# Patient Record
Sex: Female | Born: 1950 | Race: White | Hispanic: No | Marital: Married | Smoking: Never smoker
Health system: Southern US, Community
[De-identification: ages and names within clinical notes are randomized; demographics above are authoritative.]

## PROBLEM LIST (undated history)

## (undated) DIAGNOSIS — E559 Vitamin D deficiency, unspecified: Secondary | ICD-10-CM

## (undated) HISTORY — DX: Vitamin D deficiency, unspecified: E55.9

## (undated) HISTORY — PX: VEIN LIGATION AND STRIPPING: SHX2653

---

## 1997-12-03 ENCOUNTER — Other Ambulatory Visit: Admission: RE | Admit: 1997-12-03 | Discharge: 1997-12-03 | Payer: Self-pay | Admitting: Obstetrics and Gynecology

## 1998-07-21 ENCOUNTER — Ambulatory Visit (HOSPITAL_BASED_OUTPATIENT_CLINIC_OR_DEPARTMENT_OTHER): Admission: RE | Admit: 1998-07-21 | Discharge: 1998-07-21 | Payer: Self-pay | Admitting: General Surgery

## 2000-12-15 ENCOUNTER — Encounter: Payer: Self-pay | Admitting: Vascular Surgery

## 2000-12-20 ENCOUNTER — Ambulatory Visit (HOSPITAL_COMMUNITY): Admission: RE | Admit: 2000-12-20 | Discharge: 2000-12-20 | Payer: Self-pay | Admitting: Vascular Surgery

## 2000-12-20 ENCOUNTER — Encounter (INDEPENDENT_AMBULATORY_CARE_PROVIDER_SITE_OTHER): Payer: Self-pay | Admitting: *Deleted

## 2001-01-17 ENCOUNTER — Other Ambulatory Visit: Admission: RE | Admit: 2001-01-17 | Discharge: 2001-01-17 | Payer: Self-pay | Admitting: Obstetrics and Gynecology

## 2002-01-03 ENCOUNTER — Encounter (HOSPITAL_COMMUNITY): Admission: RE | Admit: 2002-01-03 | Discharge: 2002-01-03 | Payer: Self-pay | Admitting: Internal Medicine

## 2002-02-28 ENCOUNTER — Other Ambulatory Visit: Admission: RE | Admit: 2002-02-28 | Discharge: 2002-02-28 | Payer: Self-pay | Admitting: Obstetrics and Gynecology

## 2003-07-22 ENCOUNTER — Other Ambulatory Visit: Admission: RE | Admit: 2003-07-22 | Discharge: 2003-07-22 | Payer: Self-pay | Admitting: Obstetrics and Gynecology

## 2004-07-19 ENCOUNTER — Other Ambulatory Visit: Admission: RE | Admit: 2004-07-19 | Discharge: 2004-07-19 | Payer: Self-pay | Admitting: Obstetrics and Gynecology

## 2005-07-25 ENCOUNTER — Other Ambulatory Visit: Admission: RE | Admit: 2005-07-25 | Discharge: 2005-07-25 | Payer: Self-pay | Admitting: Obstetrics and Gynecology

## 2006-03-20 ENCOUNTER — Ambulatory Visit: Payer: Self-pay | Admitting: Internal Medicine

## 2006-03-21 ENCOUNTER — Ambulatory Visit: Payer: Self-pay | Admitting: Internal Medicine

## 2006-03-24 ENCOUNTER — Ambulatory Visit: Payer: Self-pay | Admitting: Internal Medicine

## 2006-03-24 LAB — CONVERTED CEMR LAB
ALT: 43 units/L — ABNORMAL HIGH (ref 0–40)
AST: 41 units/L — ABNORMAL HIGH (ref 0–37)
Albumin: 3.7 g/dL (ref 3.5–5.2)
Alkaline Phosphatase: 74 units/L (ref 39–117)
BUN: 9 mg/dL (ref 6–23)
Bilirubin, Direct: 0.1 mg/dL (ref 0.0–0.3)
Creatinine, Ser: 0.6 mg/dL (ref 0.4–1.2)
Total Bilirubin: 0.7 mg/dL (ref 0.3–1.2)
Total Protein: 7.1 g/dL (ref 6.0–8.3)

## 2006-03-28 ENCOUNTER — Ambulatory Visit: Payer: Self-pay | Admitting: Cardiology

## 2006-04-24 ENCOUNTER — Ambulatory Visit: Payer: Self-pay | Admitting: Internal Medicine

## 2006-04-24 LAB — CONVERTED CEMR LAB
ALT: 18 units/L (ref 0–40)
AST: 21 units/L (ref 0–37)
Bilirubin, Direct: 0.1 mg/dL (ref 0.0–0.3)
Total Protein: 7.3 g/dL (ref 6.0–8.3)

## 2006-07-05 ENCOUNTER — Ambulatory Visit: Payer: Self-pay | Admitting: Internal Medicine

## 2006-07-05 LAB — CONVERTED CEMR LAB
BUN: 17 mg/dL (ref 6–23)
Creatinine, Ser: 0.7 mg/dL (ref 0.4–1.2)

## 2006-07-07 ENCOUNTER — Ambulatory Visit (HOSPITAL_COMMUNITY): Admission: RE | Admit: 2006-07-07 | Discharge: 2006-07-07 | Payer: Self-pay | Admitting: Internal Medicine

## 2006-07-28 ENCOUNTER — Other Ambulatory Visit: Admission: RE | Admit: 2006-07-28 | Discharge: 2006-07-28 | Payer: Self-pay | Admitting: Obstetrics and Gynecology

## 2007-06-26 ENCOUNTER — Telehealth (INDEPENDENT_AMBULATORY_CARE_PROVIDER_SITE_OTHER): Payer: Self-pay | Admitting: *Deleted

## 2007-06-26 ENCOUNTER — Telehealth: Payer: Self-pay | Admitting: Internal Medicine

## 2007-07-04 ENCOUNTER — Ambulatory Visit (HOSPITAL_COMMUNITY): Admission: RE | Admit: 2007-07-04 | Discharge: 2007-07-04 | Payer: Self-pay | Admitting: Internal Medicine

## 2007-07-05 ENCOUNTER — Encounter: Payer: Self-pay | Admitting: Internal Medicine

## 2008-01-29 IMAGING — CT CT ABDOMEN WO/W CM
4 of 11 series · 12 of 46 positions shown, 18 images · IV contrast (omnipaque)
Comparison: [HOSPITAL] Ultrasound 03/21/06.

CLINICAL DATA: Echogenic liver mass reported on outside ultrasound.  
ABDOMEN CT WITHOUT AND WITH CONTRAST:
TECHNIQUE: Multidetector CT imaging of the abdomen was performed both before and during bolus administration of intravenous contrast according to liver mass protocol.
Contrast:  125 cc Omnipaque 300

[Series 2: non_contrast 5.0 b30f st · axial · 0.57mm/px · z∈[-250,-100]mm · 4 of 50 slices shown, 9 images]
[im 10/50  soft-tissue]
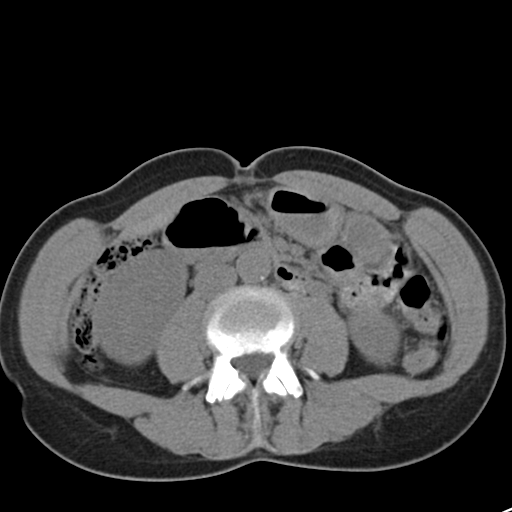
[im 10/50  lung]
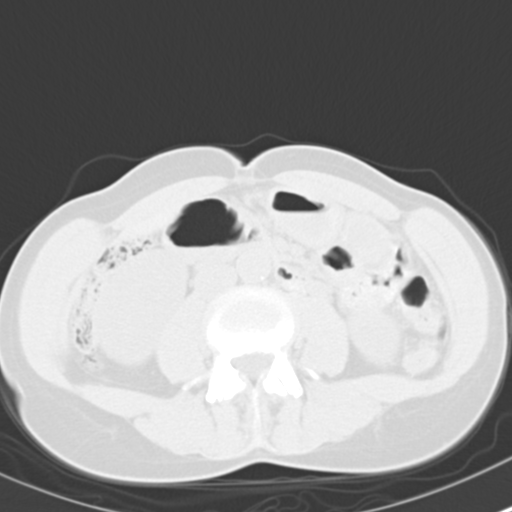
[im 10/50  bone]
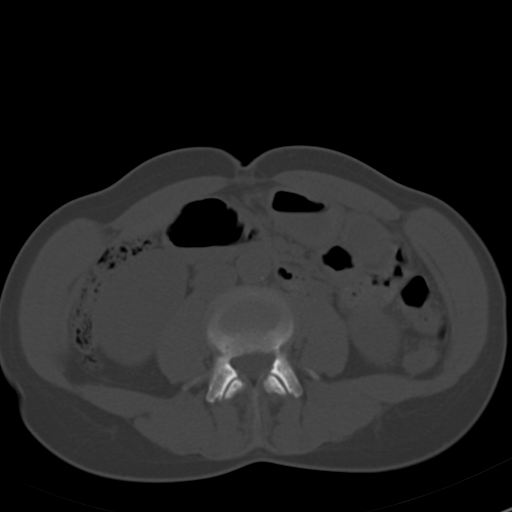
[im 20/50  soft-tissue]
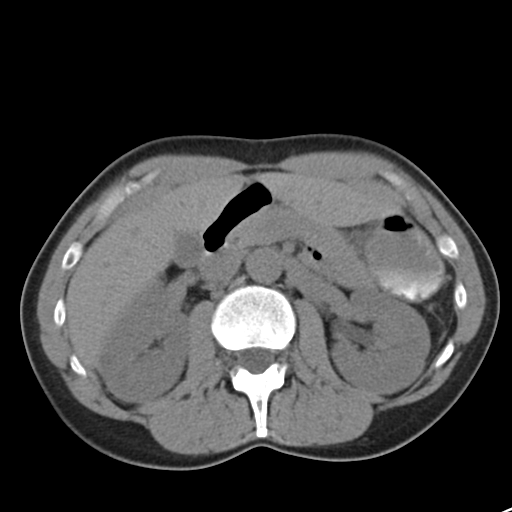
[im 20/50  lung]
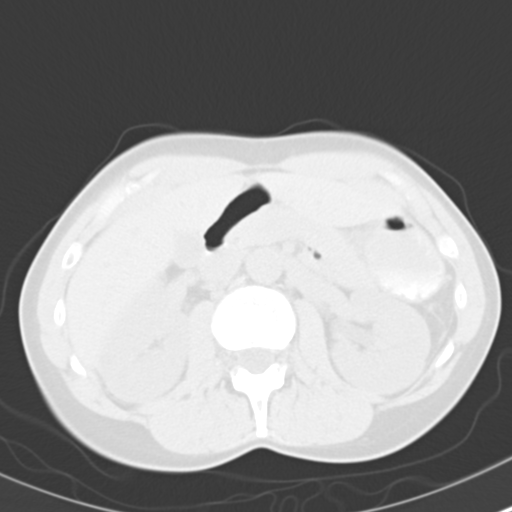
[im 30/50  soft-tissue]
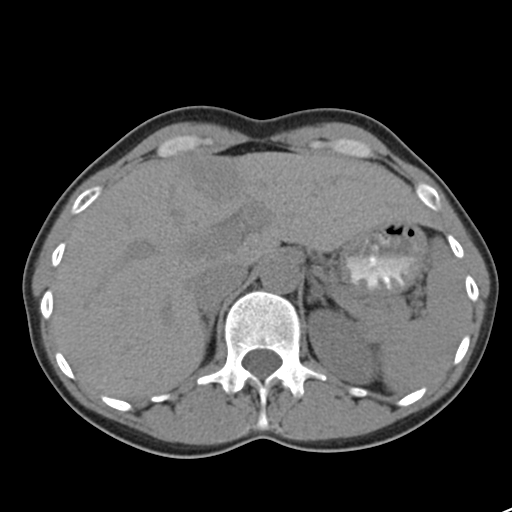
[im 30/50  lung]
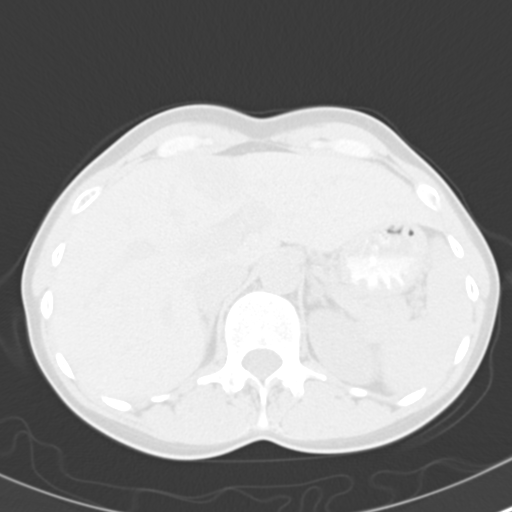
[im 40/50  soft-tissue]
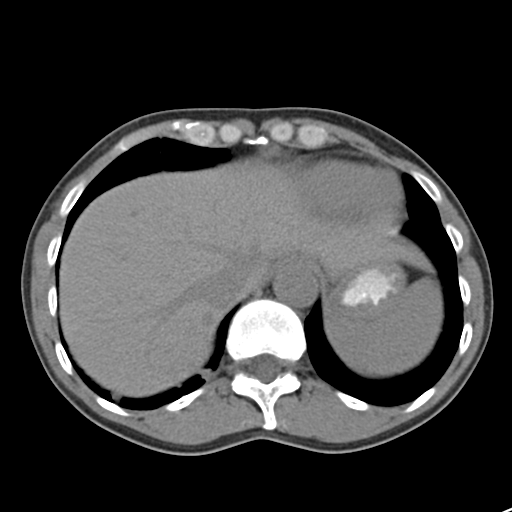
[im 40/50  lung]
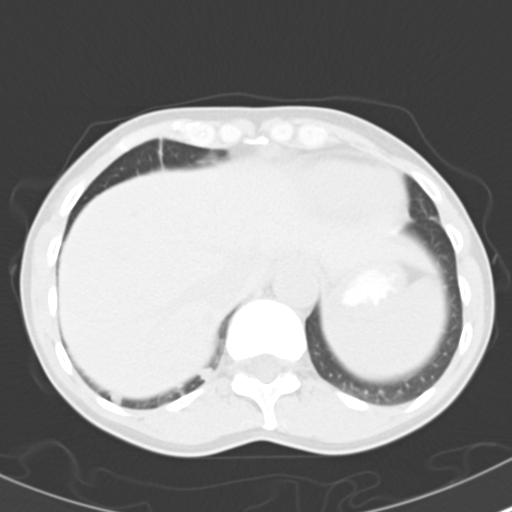

[Series 3: arterial_(id) 5.0 b30f st · axial · 0.59mm/px · z∈[-250,-100]mm · 4 of 50 slices shown]
[im 10/50  soft-tissue]
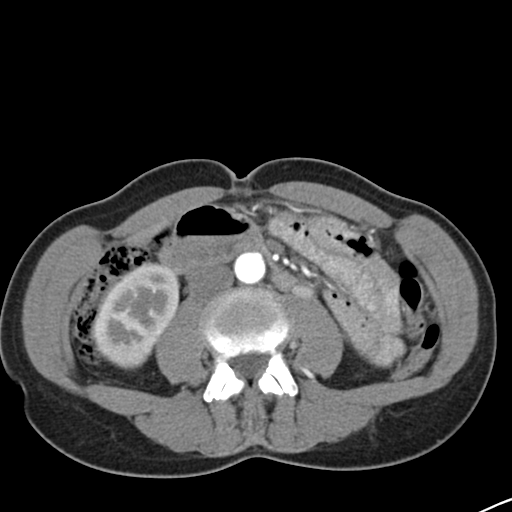
[im 20/50  soft-tissue]
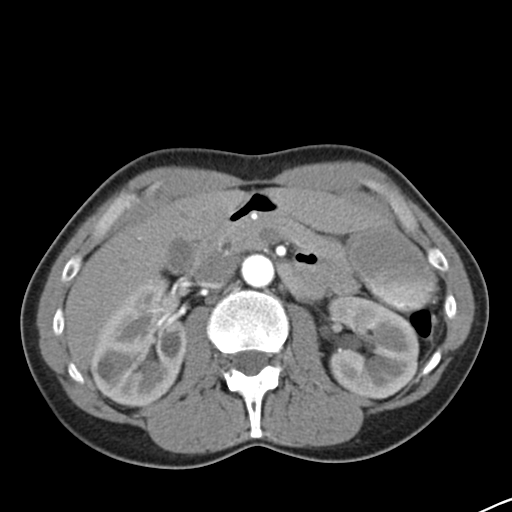
[im 30/50  soft-tissue]
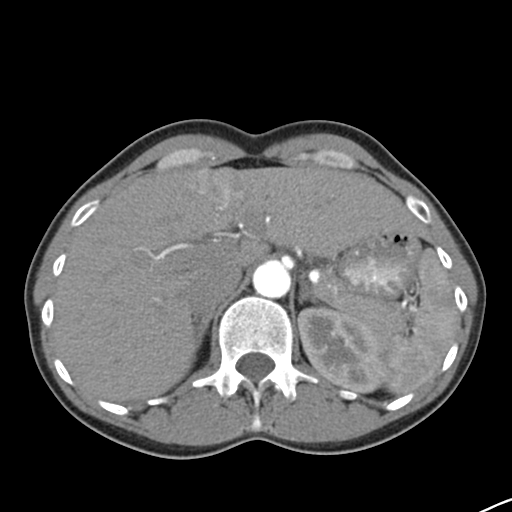
[im 40/50  soft-tissue]
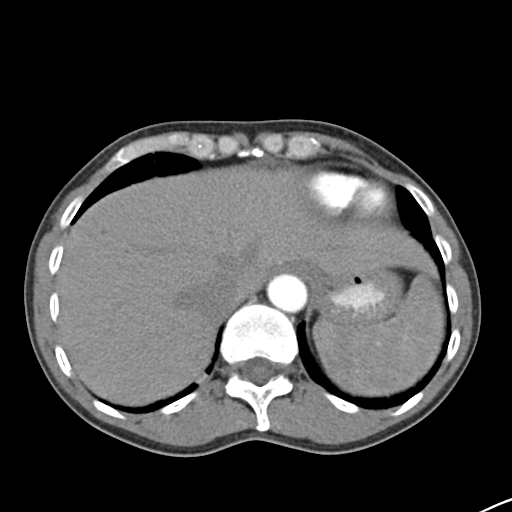

[Series 4: (id) 5.0 b30f · axial · 0.58mm/px · z∈[-250,-200]mm · 2 of 50 slices shown]
[im 10/50  soft-tissue]
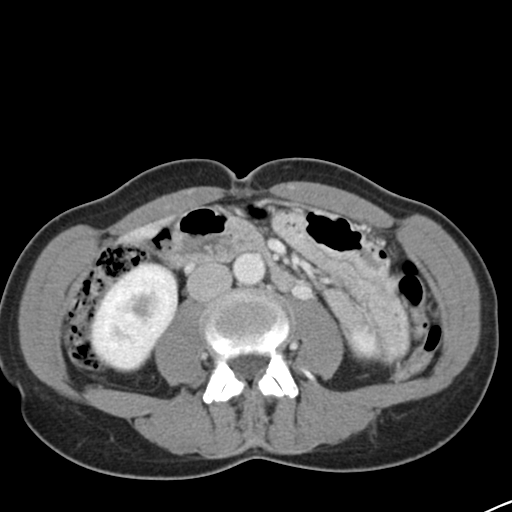
[im 20/50  soft-tissue]
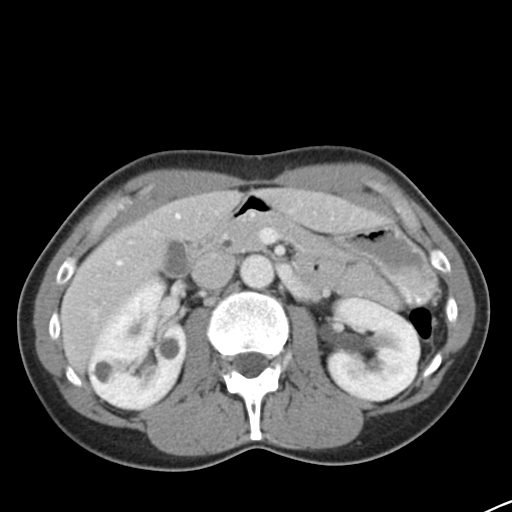

[Series 602: <mpr thick range> · coronal · 0.59mm/px · 2 of 65 slices shown, 3 images]
[im 22/65  soft-tissue]
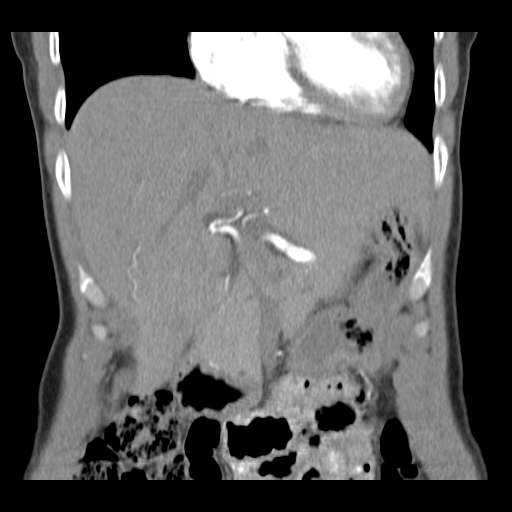
[im 22/65  bone]
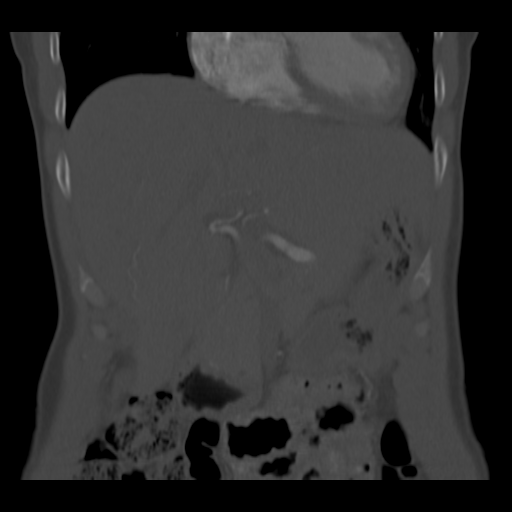
[im 43/65  soft-tissue]
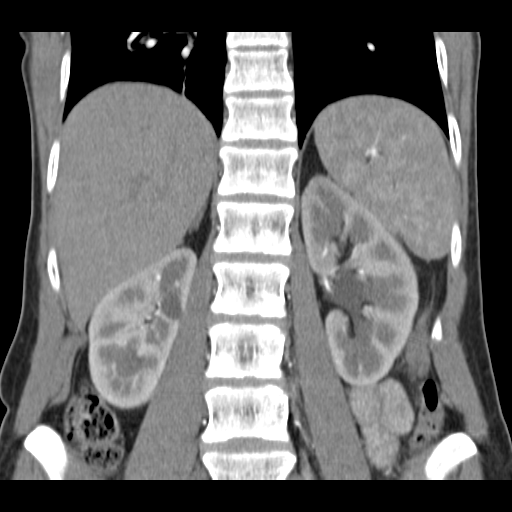

[12 of 46 positions shown; findings below may reference images not displayed]

FINDINGS: Nodular and plate-like bilateral lower lobe atelectasis is present.  At least two punctate nonobstructing left mid renal calculi are identified.  Two right mid kidney cysts are identified measuring 1.1 and 1.2 cm, respectively.  Other subcentimeter too small to characterize hypodense lesions are noted in the kidneys bilaterally.  The spleen, adrenal glands, and pancreas are unremarkable.  
Within the anterior segment right hepatic lobe adjacent to the gallbladder fossa is a 1.3 x 1.2 cm hypodense mass with peripheral nodular enhancement that corresponds to the mass seen on ultrasound.  There is an additional, larger mass within the medial segment left hepatic lobe, not identified on prior ultrasound, measuring 3.3 x 2.1 cm.  This abuts the liver capsule and is slightly hypodense with respect to liver parenchyma on noncontrast images, with minimal enhancement postcontrast and the suggestion of a centrally hypodense area that could represent a scar.  There is a suggestion of delayed enhancement of this centrally hypodense area.  Finally, vessels appear to traverse the mass rapidly being displaced by it.  Several too small to characterize hypodensities are present in the right hepatic lobe which are too small to further characterize.  No acute osseous findings.
IMPRESSION: 1.  1.3 cm mass in the anterior segment right hepatic lobe adjacent to the gallbladder fossa demonstrates imaging characteristics most consistent with hemangioma, especially when taking into account its ultrasound appearance.  
2.  In addition, there is a 3.3 cm subcapsular mass in the medial segment left hepatic lobe not reported on prior ultrasound, with imaging features most suggestive of focal nodular hyperplasia.  However, this appearance is not pathognomonic and hepatocellular carcinoma (particularly fibrolamellar subtype) could mimic this appearance.  If the patient has risk factors for hepatocellular carcinoma, biopsy would be recommended.  However, due to the mass's extremely superficial location without overlying normal hepatic parenchyma, the approach may be somewhat difficult and due to its more typical appearance for focal nodular hyperplasia, I would favor three month followup MRI at this time.  
3.  Bilateral renal cysts.  
A message regarding these findings was left with Ceejay for Dr. Ofis Mebeli by Dr. Atif on 03/28/06.

## 2008-06-16 ENCOUNTER — Telehealth: Payer: Self-pay | Admitting: Internal Medicine

## 2008-06-20 DIAGNOSIS — K7689 Other specified diseases of liver: Secondary | ICD-10-CM | POA: Insufficient documentation

## 2008-06-25 ENCOUNTER — Ambulatory Visit (HOSPITAL_COMMUNITY): Admission: RE | Admit: 2008-06-25 | Discharge: 2008-06-25 | Payer: Self-pay | Admitting: Internal Medicine

## 2008-08-04 ENCOUNTER — Telehealth: Payer: Self-pay | Admitting: Internal Medicine

## 2009-06-30 ENCOUNTER — Other Ambulatory Visit: Admission: RE | Admit: 2009-06-30 | Discharge: 2009-06-30 | Payer: Self-pay | Admitting: Family Medicine

## 2010-03-07 ENCOUNTER — Encounter: Payer: Self-pay | Admitting: Internal Medicine

## 2010-05-25 LAB — CREATININE, SERUM
Creatinine, Ser: 0.67 mg/dL (ref 0.4–1.2)
GFR calc Af Amer: >60 mL/min (ref >60)

## 2010-06-29 NOTE — Letter (Signed)
Jul 07, 2006    Dario Guardian, M.D.  [address]  [place], [place] [address]   RE:  CYERRA, Alice Nunez  MRN:  [id number]  /  DOB:  [birth year]   Dear Sonny Masters,   I would like to give you followup on Indiyah Massey's liver lesion.  As you  remember, we found a small lesion in the medial segment of the left lobe  of the liver on CT scan, which appeared to be solid, not cystic, and  vascular.  MRI was done in three month intervals to assess the size as  well as the nature of the lesion.  On Jul 07, 2006, her MRI showed that  the size of the lesion has not changed since the last CT scan four  months ago.  It measures 3.6 x 2.7 cm.  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  Another supporting  information is that her alpha fetoprotein is normal, and her liver  function tests are also normal.   I think the best way to follow this lesion is to repeat an MRI in one  year.  I do not feel that she needs a liver biopsy since it would carry  some risk of bleeding and complications, and the yield would be very  low.  I have notified Dewayne Hatch of our plans.  Please let me know if you need  anymore information or if you have some specific questions concerning  this lesion.  It is a pleasure to follow this nice lady with you.    Sincerely,      Hedwig Morton. Juanda Chance, MD  Electronically Signed    DMB/MedQ  DD: 07/07/2006  DT: 07/07/2006  Job #: [id number]

## 2010-07-02 NOTE — Op Note (Signed)
[place]  Patient:    Alice Nunez, Alice Nunez Visit Number: [id number] MRN: [id number]          Service Type: Attending:  Quita Skye. Hart Rochester, M.D. Dictated by:   Quita Skye Hart Rochester, M.D. Proc. Date: 12/20/00                             Operative Report  PREOPERATIVE DIAGNOSIS: Varicose veins greater saphenous system, right leg.  POSTOPERATIVE DIAGNOSIS: Varicose veins greater saphenous system, right leg.  OPERATION: Ligation and stripping of greater saphenous vein, right leg with excision of multiple varicosities.  SURGEON: Quita Skye. Hart Rochester, M.D.  FIRST ASSISTANT: Dominica Severin, P.A.  ANESTHESIA: General endotracheal.  DESCRIPTION OF PROCEDURE: The patient was taken to the operating room and placed in the supine position at which time satisfactory general endotracheal anesthesia was administered using an LMA endotracheal tube. The right leg was prepped with Betadine scrub and solution, draped in the routine sterile manner. The varicosities had been marked with the patient in the upright position prior to induction of anesthesia. A short oblique incision was made in the inguinal region on the right side and the saphenofemoral junction dissected free.  All branches ligated with 3-0 silk ties and divided. The saphenous vein was ligated at its entrance to the common femoral vein. It was then opened and a intraluminal stripper passed distally where it was palpated at the knee where the distal vein had been previously ligated and stripped distally. A short transverse incision was made in this area and the vein was identified, and a small stripper head secured proximally. Short stab wound type transverse incisions were made over the marked varicosities in the calf, both anteriorly and posteriorly and in the distal thigh and the varicosities were moved using a dissection and avulsion technique. When this had all been completed, the patient was placed in a  Trendelenburg position, leg wrapped and pressure applied for 5 minutes for hemostasis. The wounds were then all closed in subcuticular fashion with 3-0 Vicryl and Steri-Strips. Sterile dressing applied consisting of 4 x 4s, Webril and an Ace wrap and the patient taken to the recovery room in satisfactory condition. Dictated by:   Quita Skye Hart Rochester, M.D. Attending:  Quita Skye. Hart Rochester, M.D. DD:  12/20/00 TD:  12/21/00 Job: [id number]

## 2010-07-02 NOTE — Assessment & Plan Note (Signed)
[facility]                         GASTROENTEROLOGY OFFICE NOTE   Alice Nunez, Alice Nunez                          MRN:          [id number]  DATE:03/20/2006                            DOB:          23-Jun-1950    Alice Nunez is a 60 year old white female whom we saw in the past for  screening colonoscopy.  Colonoscopy was done in August 2005.  She had an  episode of upper abdominal pain, several months ago which lasted about 2  days.  She had tenderness in the left upper quadrant anteriorly which  did not radiate to the back.  It did not seem to interfere with her  bowel habits or with her eating.  The pain went away.  She feels that  the episode might have been precipitated by stress with one of her  children.  Her weight has been stable.  She has no preexisting GI  history.   MEDICATIONS:  Omega 3 fish oil, biotin and multiple vitamins with iron.   PAST HISTORY:  Significant  for anemia which responded to iron.   SOCIAL HISTORY:  She is married with 3 children.  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  She does not smoke and drinks alcohol  occasionally.   REVIEW OF SYSTEMS:  Positive for arthritic complaints.  Weight has been  stable.   PHYSICAL EXAMINATION:  Blood pressure 118/72, pulse 60, and weight 140  pounds.  She is alert and oriented in no distress.  LUNGS:  Clear to auscultation.  COR:  With normal S1 and S2.  ABDOMEN:  Soft.  Normoactive bowel sounds, relaxed with no abnormal  pulsations.  Liver edge at costal margin, no scars.  I could not illicit  any tenderness in her abdomen.  RECTAL:  Shows normal extra tone with Hemoccult negative stool.   IMPRESSION:  A 60 year old white female with single episode of abdominal  pain lasting 2 days which resolved without any residual symptoms.  There  is no evidence of failure to thrive or any specific GI symptomatology.  The episode might have been related to an irritable bowel syndrome or  hyperacidity.  She is quite concerned about ruling out any space  occupying lesion, therefore, I would proceed to upper abdominal  ultrasound.   PLAN:  1. Upper abdominal ultrasound.  2. I suggested the use of antispasmodics if episode recurs, but she      says that she would rather wait and see.  She will give [place] a call      if the symptoms recur.  3. To followup with Dr. Merri Brunette.  She is getting a blood test on      a regular basis there so we are not going to repeat any blood test      today.     Alice Nunez. Juanda Chance, MD  Electronically Signed    DMB/MedQ  DD: 03/20/2006  DT: 03/20/2006  Job #: [id number]   cc:   Dario Guardian, M.D.

## 2012-11-26 ENCOUNTER — Other Ambulatory Visit (HOSPITAL_COMMUNITY)
Admission: RE | Admit: 2012-11-26 | Discharge: 2012-11-26 | Disposition: A | Discharge status: Home / Self Care | Payer: BC Managed Care – PPO | Source: Ambulatory Visit | Attending: Family Medicine | Admitting: Family Medicine

## 2012-11-26 ENCOUNTER — Other Ambulatory Visit: Payer: Self-pay | Admitting: Family Medicine

## 2012-11-26 DIAGNOSIS — Z0000 Encounter for general adult medical examination without abnormal findings: Secondary | ICD-10-CM | POA: Insufficient documentation

## 2012-11-30 ENCOUNTER — Encounter: Payer: Self-pay | Admitting: Internal Medicine

## 2012-12-18 ENCOUNTER — Encounter: Payer: BC Managed Care – PPO | Admitting: *Deleted

## 2012-12-19 ENCOUNTER — Encounter: Payer: Self-pay | Admitting: Internal Medicine

## 2013-01-09 ENCOUNTER — Encounter: Payer: Self-pay | Admitting: Internal Medicine

## 2013-07-17 ENCOUNTER — Encounter: Payer: Self-pay | Admitting: Internal Medicine

## 2019-11-13 DIAGNOSIS — Z20822 Contact with and (suspected) exposure to covid-19: Secondary | ICD-10-CM | POA: Diagnosis not present

## 2020-01-01 DIAGNOSIS — Z20822 Contact with and (suspected) exposure to covid-19: Secondary | ICD-10-CM | POA: Diagnosis not present

## 2020-01-08 DIAGNOSIS — Z20822 Contact with and (suspected) exposure to covid-19: Secondary | ICD-10-CM | POA: Diagnosis not present

## 2020-01-09 DIAGNOSIS — Z03818 Encounter for observation for suspected exposure to other biological agents ruled out: Secondary | ICD-10-CM | POA: Diagnosis not present

## 2020-01-09 DIAGNOSIS — Z20822 Contact with and (suspected) exposure to covid-19: Secondary | ICD-10-CM | POA: Diagnosis not present

## 2020-01-10 DIAGNOSIS — Z20822 Contact with and (suspected) exposure to covid-19: Secondary | ICD-10-CM | POA: Diagnosis not present

## 2020-09-10 DIAGNOSIS — U071 COVID-19: Secondary | ICD-10-CM | POA: Diagnosis not present

## 2020-11-26 DIAGNOSIS — Z13220 Encounter for screening for lipoid disorders: Secondary | ICD-10-CM | POA: Diagnosis not present

## 2020-11-26 DIAGNOSIS — Z0000 Encounter for general adult medical examination without abnormal findings: Secondary | ICD-10-CM | POA: Diagnosis not present

## 2020-11-26 DIAGNOSIS — Z124 Encounter for screening for malignant neoplasm of cervix: Secondary | ICD-10-CM | POA: Diagnosis not present

## 2020-11-26 DIAGNOSIS — Z23 Encounter for immunization: Secondary | ICD-10-CM | POA: Diagnosis not present

## 2020-12-09 DIAGNOSIS — Z7189 Other specified counseling: Secondary | ICD-10-CM | POA: Diagnosis not present

## 2020-12-09 DIAGNOSIS — Z7184 Encounter for health counseling related to travel: Secondary | ICD-10-CM | POA: Diagnosis not present

## 2021-01-06 DIAGNOSIS — Z780 Asymptomatic menopausal state: Secondary | ICD-10-CM | POA: Diagnosis not present

## 2021-01-06 DIAGNOSIS — M810 Age-related osteoporosis without current pathological fracture: Secondary | ICD-10-CM | POA: Diagnosis not present

## 2021-01-06 DIAGNOSIS — M85852 Other specified disorders of bone density and structure, left thigh: Secondary | ICD-10-CM | POA: Diagnosis not present

## 2021-01-06 DIAGNOSIS — Z1231 Encounter for screening mammogram for malignant neoplasm of breast: Secondary | ICD-10-CM | POA: Diagnosis not present

## 2021-01-06 DIAGNOSIS — M85851 Other specified disorders of bone density and structure, right thigh: Secondary | ICD-10-CM | POA: Diagnosis not present

## 2022-05-03 ENCOUNTER — Telehealth: Payer: Self-pay | Admitting: Physician Assistant

## 2022-05-03 NOTE — Telephone Encounter (Signed)
scheduled per 3/18 referral, pt has been called and confirmed date and time. Pt is aware of location and to arrive early for check in

## 2022-05-25 ENCOUNTER — Encounter: Payer: Self-pay | Admitting: Physician Assistant

## 2022-05-25 ENCOUNTER — Inpatient Hospital Stay: Payer: Managed Care, Other (non HMO) | Attending: Physician Assistant | Admitting: Physician Assistant

## 2022-05-25 ENCOUNTER — Other Ambulatory Visit: Payer: Self-pay

## 2022-05-25 ENCOUNTER — Telehealth: Payer: Self-pay | Admitting: Physician Assistant

## 2022-05-25 ENCOUNTER — Inpatient Hospital Stay: Payer: Managed Care, Other (non HMO)

## 2022-05-25 VITALS — BP 169/101 | HR 69 | Temp 97.5°F | Resp 18 | Wt 148.7 lb

## 2022-05-25 DIAGNOSIS — D709 Neutropenia, unspecified: Secondary | ICD-10-CM | POA: Diagnosis not present

## 2022-05-25 DIAGNOSIS — D72819 Decreased white blood cell count, unspecified: Secondary | ICD-10-CM | POA: Diagnosis present

## 2022-05-25 LAB — CMP (CANCER CENTER ONLY)
ALT: 19 U/L (ref 0–44)
AST: 20 U/L (ref 15–41)
Albumin: 4.4 g/dL (ref 3.5–5.0)
Alkaline Phosphatase: 75 U/L (ref 38–126)
Anion gap: 5 (ref 5–15)
BUN: 21 mg/dL (ref 8–23)
CO2: 29 mmol/L (ref 22–32)
Calcium: 9.7 mg/dL (ref 8.9–10.3)
Chloride: 104 mmol/L (ref 98–111)
Creatinine: 0.63 mg/dL (ref 0.44–1.00)
GFR, Estimated: >60 mL/min (ref >60)
Glucose, Bld: 93 mg/dL (ref 70–99)
Potassium: 4 mmol/L (ref 3.5–5.1)
Sodium: 138 mmol/L (ref 135–145)
Total Bilirubin: 0.8 mg/dL (ref 0.3–1.2)
Total Protein: 8.2 g/dL — ABNORMAL HIGH (ref 6.5–8.1)

## 2022-05-25 LAB — HEPATITIS B SURFACE ANTIBODY,QUALITATIVE: Hep B S Ab: REACTIVE — AB

## 2022-05-25 LAB — CBC WITH DIFFERENTIAL/PLATELET
Abs Immature Granulocytes: 0.01 10*3/uL (ref 0.00–0.07)
Basophils Absolute: 0 10*3/uL (ref 0.0–0.1)
Basophils Relative: 1 %
Eosinophils Absolute: 0 10*3/uL (ref 0.0–0.5)
Eosinophils Relative: 1 %
HCT: 39.8 % (ref 36.0–46.0)
Hemoglobin: 13.6 g/dL (ref 12.0–15.0)
Immature Granulocytes: 1 %
Lymphocytes Relative: 39 %
Lymphs Abs: 0.8 10*3/uL (ref 0.7–4.0)
MCH: 30.5 pg (ref 26.0–34.0)
MCHC: 34.2 g/dL (ref 30.0–36.0)
MCV: 89.2 fL (ref 80.0–100.0)
Monocytes Absolute: 0.2 10*3/uL (ref 0.1–1.0)
Monocytes Relative: 7 %
Neutro Abs: 1.1 10*3/uL — ABNORMAL LOW (ref 1.7–7.7)
Neutrophils Relative %: 51 %
Platelets: 265 10*3/uL (ref 150–400)
RBC: 4.46 MIL/uL (ref 3.87–5.11)
RDW: 13.3 % (ref 11.5–15.5)
WBC: 2.2 10*3/uL — ABNORMAL LOW (ref 4.0–10.5)
nRBC: 0 % (ref 0.0–0.2)

## 2022-05-25 LAB — C-REACTIVE PROTEIN: CRP: 0.5 mg/dL (ref <1.0)

## 2022-05-25 LAB — HEPATITIS C ANTIBODY: HCV Ab: NONREACTIVE

## 2022-05-25 LAB — HEPATITIS B SURFACE ANTIGEN: Hepatitis B Surface Ag: NONREACTIVE

## 2022-05-25 LAB — HIV ANTIBODY (ROUTINE TESTING W REFLEX): HIV Screen 4th Generation wRfx: NONREACTIVE

## 2022-05-25 LAB — VITAMIN B12: Vitamin B-12: 314 pg/mL (ref 180–914)

## 2022-05-25 LAB — HEPATITIS B CORE ANTIBODY, TOTAL: Hep B Core Total Ab: NONREACTIVE

## 2022-05-25 LAB — SAVE SMEAR(SSMR), FOR PROVIDER SLIDE REVIEW

## 2022-05-25 LAB — SEDIMENTATION RATE: Sed Rate: 11 mm/hr (ref 0–22)

## 2022-05-25 LAB — FOLATE: Folate: 9.5 ng/mL (ref >5.9)

## 2022-05-25 NOTE — Progress Notes (Signed)
[facility] [phone]   [phone]  INITIAL CONSULT NOTE  Patient Care Team: Merri BrunetteSmith, Candace, MD as PCP - General (Family Medicine)  Hematological/Oncological History Labs from PCP, Horton MarshallAnna Becker PA-C: 04/15/2022: WBC 2.3 (L), Hgb 13.5, Plt 254, ANC 1.1 (L) 04/27/2022: WBC 2.2 (L), Hgb 13.0, Plt 295, ANC 0.9 (L) 05/25/2022: [facility]   CHIEF COMPLAINTS/PURPOSE OF CONSULTATION:  Leukopenia/Neutropenia  HISTORY OF PRESENTING ILLNESS:  Alice Nunez 72 y.o. female with medical history significant for vitamin D deficiency presents to the hematology clinic for evaluation of leukopenia/neutropenia. She is unaccompanied for this visit.   On exam today, Ms. Alice Nunez reports she is feeling well without any new or concerning symptoms. Her energy levels are stable and she is able to complete her ADLs on her own. She has a good appetite and denies any weight changes. She denies any recurrent infections. She denies any GI symptoms including nausea, vomiting, diarrhea or constipation. She denies any signs of bruising or bleeding. She denies fevers, chills, sweats, shortness of breath, chest pain or cough. She has no other complaints. Rest of the 10 point ROS is below.   MEDICAL HISTORY:  Past Medical History:  Diagnosis Date   Vitamin D deficiency     SURGICAL HISTORY: Past Surgical History:  Procedure Laterality Date   VEIN LIGATION AND STRIPPING      SOCIAL HISTORY: Social History   Socioeconomic History   Marital status: Single    Spouse name: Not on file   Number of children: Not on file   Years of education: Not on file   Highest education level: Not on file  Occupational History   Not on file  Tobacco Use   Smoking status: Not on file   Smokeless tobacco: Not on file  Substance and Sexual Activity   Alcohol use: Not on file   Drug use: Not on file   Sexual activity: Not on file  Other Topics Concern   Not on file   Social History Narrative   Not on file   Social Determinants of Health   Financial Resource Strain: Not on file  Food Insecurity: Not on file  Transportation Needs: Not on file  Physical Activity: Not on file  Stress: Not on file  Social Connections: Not on file  Intimate Partner Violence: Not on file    FAMILY HISTORY: History reviewed. No pertinent family history.  ALLERGIES:  has No Known Allergies.  MEDICATIONS:  Current Outpatient Medications  Medication Sig Dispense Refill   alendronate (FOSAMAX) 70 MG tablet Take by mouth.     calcium carbonate (CALCIUM 600) 600 MG TABS tablet Take 600 mg by mouth 2 (two) times daily with a meal.     VITAMIN D3 1.25 MG (50000 UT) capsule Take 50,000 Units by mouth once a week.     No current facility-administered medications for this visit.    REVIEW OF SYSTEMS:   Constitutional: ( - ) fevers, ( - )  chills , ( - ) night sweats Eyes: ( - ) blurriness of vision, ( - ) double vision, ( - ) watery eyes Ears, nose, mouth, throat, and face: ( - ) mucositis, ( - ) sore throat Respiratory: ( - ) cough, ( - ) dyspnea, ( - ) wheezes Cardiovascular: ( - ) palpitation, ( - ) chest discomfort, ( - ) lower extremity swelling Gastrointestinal:  ( - ) nausea, ( - ) heartburn, ( - ) change in bowel habits  Skin: ( - ) abnormal skin rashes Lymphatics: ( - ) new lymphadenopathy, ( - ) easy bruising Neurological: ( - ) numbness, ( - ) tingling, ( - ) new weaknesses Behavioral/Psych: ( - ) mood change, ( - ) new changes  All other systems were reviewed with the patient and are negative.  PHYSICAL EXAMINATION: ECOG PERFORMANCE STATUS: 0 - Asymptomatic  Vitals:   05/25/22 1117  BP: (!) 169/101  Pulse: 69  Resp: 18  Temp: (!) 97.5 F (36.4 C)  SpO2: 97%   Filed Weights   05/25/22 1117  Weight: 148 lb 11.2 oz (67.4 kg)    GENERAL: well appearing female in NAD  SKIN: skin color, texture, turgor are normal, no rashes or significant  lesions EYES: conjunctiva are pink and non-injected, sclera clear OROPHARYNX: no exudate, no erythema; lips, buccal mucosa, and tongue normal  NECK: supple, non-tender LYMPH:  no palpable lymphadenopathy in the cervical or supraclavicular lymph nodes.  LUNGS: clear to auscultation and percussion with normal breathing effort HEART: regular rate & rhythm and no murmurs and no lower extremity edema Musculoskeletal: no cyanosis of digits and no clubbing  PSYCH: alert & oriented x 3, fluent speech NEURO: no focal motor/sensory deficits  LABORATORY DATA:  I have reviewed the data as listed     No data to display             Latest Ref Rng & Units 06/25/2008    7:55 AM 07/05/2006   12:05 PM 04/24/2006    4:08 PM  CMP  BUN 6 - 23 mg/dL 17  17    Creatinine 0.4 - 1.2 mg/dL 5.36  0.7    Total Protein 6.0 - 8.3 g/dL   7.3   Total Bilirubin 0.3 - 1.2 mg/dL   1.0   Alkaline Phos 39 - 117 units/L   54   AST 0 - 37 units/L   21   ALT 0 - 40 units/L   18     ASSESSMENT & PLAN Alice Nunez is a 72 y.o. female who presents to the clinic for evaluation of leukopenia/neutropenia.  The differentials for neutropenia include  infectious etiology, nutritional etiology, inflammatory etiology, or medication.  The patient does not take any medications known to cause neutropenia.  We will order viral studies with hep B, hep C, and HIV as well.  In addition, we will check for nutritional anemias with B12 and folate levels.    Assuming none of the studies show any concerning abnormalities, we will see the patient back in 6 months time to continue to monitor.      # Leukopenia/Neutropenia  --Rule out infectious etiology with hepatitis B serologies, hepatitis C serologies, and HIV.  --Nutritional evaluation with  vitamin B12, MMA and folate.  --Inflammatory evaluation with ESR and CRP.   --Repeat CBC and CMP.  Additionally, we will order peripheral blood film.  --Assuming there are no concerning findings  on the blood work-up today. we will plan to see the patient back in approximately 6 months time.    Orders Placed This Encounter  Procedures   CBC with Differential (Cancer Center Only)    Standing Status:   Future    Standing Expiration Date:   05/25/2023   CMP (Cancer Center only)    Standing Status:   Future    Standing Expiration Date:   05/25/2023   Sedimentation rate    Standing Status:   Future    Standing Expiration Date:  05/25/2023   C-reactive protein    Standing Status:   Future    Standing Expiration Date:   05/25/2023   Methylmalonic acid, serum    Standing Status:   Future    Standing Expiration Date:   05/25/2023   Vitamin B12    Standing Status:   Future    Standing Expiration Date:   05/25/2023   Folate, Serum    Standing Status:   Future    Standing Expiration Date:   05/25/2023   Hepatitis B core antibody, total    Standing Status:   Future    Standing Expiration Date:   05/25/2023   Hepatitis B surface antibody    Standing Status:   Future    Standing Expiration Date:   05/25/2023   Hepatitis B surface antigen    Standing Status:   Future    Standing Expiration Date:   05/25/2023   Hepatitis C antibody    Standing Status:   Future    Standing Expiration Date:   05/25/2023   HIV antibody (with reflex)    Standing Status:   Future    Standing Expiration Date:   05/25/2023   Save Smear for Provider Slide Review    Standing Status:   Future    Standing Expiration Date:   05/25/2023    All questions were answered. The patient knows to call the clinic with any problems, questions or concerns.  I have spent a total of 60 minutes minutes of face-to-face and non-face-to-face time, preparing to see the patient, obtaining and/or reviewing separately obtained history, performing a medically appropriate examination, counseling and educating the patient, ordering medications/tests/procedures, referring and communicating with other health care professionals, documenting  clinical information in the electronic health record, independently interpreting results and communicating results to the patient, and care coordination.   Georga Kaufmann, PA-C Department of Hematology/Oncology [facility] Phone: [phone]  I have read the above note and personally examined the patient. I agree with the assessment and plan as noted above.  Briefly Alice Nunez is a 72 year old female who presents for evaluation of leukopenia.  At this time her ANC is tends to be in the low ones and her white blood cell counts in the 2 range.  The etiology of this is unclear at this time.  Will do full workup to include nutritional evaluation with vitamin B12 and folate, liver evaluation with hepatitis B, C, and HIV as well as inflammatory workup.  In the event the above workup is negative would recommend pursuing an ultrasound of the liver to assess for underlying liver disease.  Fortunately the patient's ANC remains above 1.0.  If no clear etiology can be found would recommend monitoring.  If the ANC were to consistently drop below 1 would need to consider bone marrow biopsy.  The patient voiced understanding of our plan moving forward.   Ulysees Barns, MD Department of Hematology/Oncology [facility] at [facility] Phone: [phone] Pager: [phone] Email: [email or web address]

## 2022-05-25 NOTE — Telephone Encounter (Signed)
Reached out to patient to schedule per 4/10 LOS, patient aware of time and date or appointments.

## 2022-05-31 LAB — METHYLMALONIC ACID, SERUM: Methylmalonic Acid, Quantitative: 144 nmol/L (ref 0–378)

## 2022-06-06 ENCOUNTER — Telehealth: Payer: Self-pay

## 2022-06-06 NOTE — Telephone Encounter (Signed)
-----   Message from Briant Cedar, PA-C sent at 06/06/2022 11:58 AM EDT ----- Please notify patient that neutropenia has improved back to 1.1. Rest of the workup was negative. Recommend to monitor for now since neutropenia is mild and no other concerning findings to suggest underlying bone marrow disorder. We will repeat levels in 6 months as scheduled.    ----- Message ----- From: Leory Plowman, [facility] [place] Sent: 05/25/2022  12:19 PM EDT To: Briant Cedar, PA-C

## 2022-06-06 NOTE — Telephone Encounter (Signed)
Pt advised with VU 

## 2022-11-23 ENCOUNTER — Inpatient Hospital Stay: Payer: Managed Care, Other (non HMO) | Attending: Physician Assistant | Admitting: Physician Assistant

## 2022-11-23 ENCOUNTER — Other Ambulatory Visit: Payer: Self-pay | Admitting: Physician Assistant

## 2022-11-23 ENCOUNTER — Inpatient Hospital Stay: Payer: Managed Care, Other (non HMO)

## 2022-11-23 VITALS — BP 139/80 | HR 66 | Temp 97.4°F | Resp 16 | Ht 68.0 in | Wt 145.3 lb

## 2022-11-23 DIAGNOSIS — D709 Neutropenia, unspecified: Secondary | ICD-10-CM | POA: Insufficient documentation

## 2022-11-23 LAB — CBC WITH DIFFERENTIAL (CANCER CENTER ONLY)
Abs Immature Granulocytes: 0.01 10*3/uL (ref 0.00–0.07)
Basophils Absolute: 0 10*3/uL (ref 0.0–0.1)
Basophils Relative: 1 %
Eosinophils Absolute: 0 10*3/uL (ref 0.0–0.5)
Eosinophils Relative: 1 %
HCT: 41.4 % (ref 36.0–46.0)
Hemoglobin: 13.8 g/dL (ref 12.0–15.0)
Immature Granulocytes: 1 %
Lymphocytes Relative: 43 %
Lymphs Abs: 0.9 10*3/uL (ref 0.7–4.0)
MCH: 29.8 pg (ref 26.0–34.0)
MCHC: 33.3 g/dL (ref 30.0–36.0)
MCV: 89.4 fL (ref 80.0–100.0)
Monocytes Absolute: 0.2 10*3/uL (ref 0.1–1.0)
Monocytes Relative: 10 %
Neutro Abs: 0.9 10*3/uL — ABNORMAL LOW (ref 1.7–7.7)
Neutrophils Relative %: 44 %
Platelet Count: 227 10*3/uL (ref 150–400)
RBC: 4.63 MIL/uL (ref 3.87–5.11)
RDW: 13.2 % (ref 11.5–15.5)
WBC Count: 2 10*3/uL — ABNORMAL LOW (ref 4.0–10.5)
nRBC: 0 % (ref 0.0–0.2)

## 2022-11-23 LAB — CMP (CANCER CENTER ONLY)
ALT: 18 U/L (ref 0–44)
AST: 21 U/L (ref 15–41)
Albumin: 4.2 g/dL (ref 3.5–5.0)
Alkaline Phosphatase: 68 U/L (ref 38–126)
Anion gap: 4 — ABNORMAL LOW (ref 5–15)
BUN: 21 mg/dL (ref 8–23)
CO2: 31 mmol/L (ref 22–32)
Calcium: 9.5 mg/dL (ref 8.9–10.3)
Chloride: 106 mmol/L (ref 98–111)
Creatinine: 0.67 mg/dL (ref 0.44–1.00)
GFR, Estimated: >60 mL/min (ref >60)
Glucose, Bld: 98 mg/dL (ref 70–99)
Potassium: 4.4 mmol/L (ref 3.5–5.1)
Sodium: 141 mmol/L (ref 135–145)
Total Bilirubin: 0.9 mg/dL (ref 0.3–1.2)
Total Protein: 7.8 g/dL (ref 6.5–8.1)

## 2022-11-23 NOTE — Progress Notes (Signed)
[facility] [phone]   [phone]  PROGRESS NOTE  Patient Care Team: Merri Brunette, MD as PCP - General (Family Medicine)  Hematological/Oncological History Labs from PCP, Horton Marshall PA-C: 04/15/2022: WBC 2.3 (L), Hgb 13.5, Plt 254, ANC 1.1 (L) 04/27/2022: WBC 2.2 (L), Hgb 13.0, Plt 295, ANC 0.9 (L) 05/25/2022: Establish care with [facility]   CHIEF COMPLAINTS/PURPOSE OF CONSULTATION:  Leukopenia/Neutropenia  HISTORY OF PRESENTING ILLNESS:  Alice Nunez 72 y.o. female returns for a follow up for leukopenia/neutropenia. She is unaccompanied for this visit.   On exam today, Alice Nunez reports she is doing well and has no new or concerning symptoms. A few months ago she was treated for a dental infection. Otherwise, she denies any new or recurrent infections. Her energy is overall stable. She denies fevers, chills, sweats, shortness of breath, chest pain, cough, nausea, vomiting or bowel habit changes. She has no other complaints. Rest of the 10 point ROS is below.   MEDICAL HISTORY:  Past Medical History:  Diagnosis Date   Vitamin D deficiency     SURGICAL HISTORY: Past Surgical History:  Procedure Laterality Date   VEIN LIGATION AND STRIPPING      SOCIAL HISTORY: Social History   Socioeconomic History   Marital status: Married    Spouse name: Not on file   Number of children: Not on file   Years of education: Not on file   Highest education level: Not on file  Occupational History   Not on file  Tobacco Use   Smoking status: Never   Smokeless tobacco: Never  Substance and Sexual Activity   Alcohol use: Yes    Alcohol/week: 8.0 standard drinks of alcohol    Types: 8 Glasses of wine per week   Drug use: Never   Sexual activity: Not on file  Other Topics Concern   Not on file  Social History Narrative   Not on file   Social Determinants of Health   Financial Resource Strain: Not on file  Food Insecurity: Not on file   Transportation Needs: Not on file  Physical Activity: Not on file  Stress: Not on file  Social Connections: Unknown (06/29/2021)   Received from [facility], [facility]   Social Network    Social Network: Not on file  Intimate Partner Violence: Unknown (05/21/2021)   Received from [facility], [facility]   HITS    Physically Hurt: Not on file    Insult or Talk Down To: Not on file    Threaten Physical Harm: Not on file    Scream or Curse: Not on file    FAMILY HISTORY: No family history on file.  ALLERGIES:  has No Known Allergies.  MEDICATIONS:  Current Outpatient Medications  Medication Sig Dispense Refill   alendronate (FOSAMAX) 70 MG tablet Take by mouth.     calcium carbonate (CALCIUM 600) 600 MG TABS tablet Take 600 mg by mouth 2 (two) times daily with a meal.     VITAMIN D3 1.25 MG (50000 UT) capsule Take 50,000 Units by mouth once a week.     No current facility-administered medications for this visit.    REVIEW OF SYSTEMS:   Constitutional: ( - ) fevers, ( - )  chills , ( - ) night sweats Eyes: ( - ) blurriness of vision, ( - ) double vision, ( - ) watery eyes Ears, nose, mouth, throat, and face: ( - ) mucositis, ( - ) sore throat Respiratory: ( - )  cough, ( - ) dyspnea, ( - ) wheezes Cardiovascular: ( - ) palpitation, ( - ) chest discomfort, ( - ) lower extremity swelling Gastrointestinal:  ( - ) nausea, ( - ) heartburn, ( - ) change in bowel habits Skin: ( - ) abnormal skin rashes Lymphatics: ( - ) new lymphadenopathy, ( - ) easy bruising Neurological: ( - ) numbness, ( - ) tingling, ( - ) new weaknesses Behavioral/Psych: ( - ) mood change, ( - ) new changes  All other systems were reviewed with the patient and are negative.  PHYSICAL EXAMINATION: ECOG PERFORMANCE STATUS: 0 - Asymptomatic  Vitals:   11/23/22 1308  BP: 139/80  Pulse: 66  Resp: 16  Temp: (!) 97.4 F (36.3 C)  SpO2: 95%   Filed Weights   11/23/22 1308  Weight: 145 lb 4.8 oz  (65.9 kg)    GENERAL: well appearing female in NAD  SKIN: skin color, texture, turgor are normal, no rashes or significant lesions EYES: conjunctiva are pink and non-injected, sclera clear LYMPH:  no palpable lymphadenopathy in the cervical or supraclavicular lymph nodes.  LUNGS: clear to auscultation and percussion with normal breathing effort HEART: regular rate & rhythm and no murmurs and no lower extremity edema Musculoskeletal: no cyanosis of digits and no clubbing  PSYCH: alert & oriented x 3, fluent speech NEURO: no focal motor/sensory deficits  LABORATORY DATA:  I have reviewed the data as listed    Latest Ref Rng & Units 11/23/2022   12:55 PM 05/25/2022   12:02 PM  CBC  WBC 4.0 - 10.5 K/uL 2.0  2.2   Hemoglobin 12.0 - 15.0 g/dL 16.1  09.6   Hematocrit 36.0 - 46.0 % 41.4  39.8   Platelets 150 - 400 K/uL 227  265        Latest Ref Rng & Units 11/23/2022   12:55 PM 05/25/2022   12:02 PM 06/25/2008    7:55 AM  CMP  Glucose 70 - 99 mg/dL 98  93    BUN 8 - 23 mg/dL 21  21  17    Creatinine 0.44 - 1.00 mg/dL 0.45  4.09  8.11   Sodium 135 - 145 mmol/L 141  138    Potassium 3.5 - 5.1 mmol/L 4.4  4.0    Chloride 98 - 111 mmol/L 106  104    CO2 22 - 32 mmol/L 31  29    Calcium 8.9 - 10.3 mg/dL 9.5  9.7    Total Protein 6.5 - 8.1 g/dL 7.8  8.2    Total Bilirubin 0.3 - 1.2 mg/dL 0.9  0.8    Alkaline Phos 38 - 126 U/L 68  75    AST 15 - 41 U/L 21  20    ALT 0 - 44 U/L 18  19      ASSESSMENT & PLAN Alice Nunez is a 72 y.o. female who presents to the clinic for a follow up for leukopenia/neutropenia.  # Leukopenia/Neutropenia  --Workup from 05/25/2022 ruled out nutritional deficiencies, hepatitis B/C and HIV. Inflammatory markers were normal. --Patient denies any recurrent infections or signs of active infections such as fevers, chills, cough, urinary symptoms.  --Labs today show overall stable leukopenia/neutropenia with WBC 2.0, ANC 900.  --Continue to monitor and no  indication for bone marrow biopsy at this time.  --RTC in 1 year with labs   No orders of the defined types were placed in this encounter.   All questions were answered. The patient  knows to call the clinic with any problems, questions or concerns.  I have spent a total of 25 minutes minutes of face-to-face and non-face-to-face time, preparing to see the patient, performing a medically appropriate examination, counseling and educating the patient, documenting clinical information in the electronic health record, independently interpreting results and communicating results to the patient, and care coordination.   Georga Kaufmann, PA-C Department of Hematology/Oncology [facility] Phone: [phone]

## 2022-11-24 ENCOUNTER — Telehealth: Payer: Self-pay | Admitting: Physician Assistant

## 2023-11-24 ENCOUNTER — Telehealth: Payer: Self-pay | Admitting: Physician Assistant

## 2023-11-24 NOTE — Telephone Encounter (Signed)
Pt calling to cancel appt.

## 2023-11-29 ENCOUNTER — Inpatient Hospital Stay: Payer: Managed Care, Other (non HMO)

## 2023-11-29 ENCOUNTER — Inpatient Hospital Stay: Payer: Managed Care, Other (non HMO) | Admitting: Physician Assistant
